# Patient Record
Sex: Female | Born: 1990 | Race: Black or African American | Hispanic: No | Marital: Single
Health system: Southern US, Community
[De-identification: ages and names within clinical notes are randomized; demographics above are authoritative.]

---
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
## 2010-01-25 ENCOUNTER — Emergency Department: Payer: Self-pay | Admitting: Emergency Medicine

## 2010-02-08 ENCOUNTER — Emergency Department: Payer: Self-pay | Admitting: Emergency Medicine

## 2010-05-05 ENCOUNTER — Encounter: Payer: Self-pay | Admitting: Obstetrics and Gynecology

## 2010-05-12 ENCOUNTER — Encounter: Payer: Self-pay | Admitting: Maternal and Fetal Medicine

## 2010-05-16 ENCOUNTER — Encounter: Payer: Self-pay | Admitting: Maternal & Fetal Medicine

## 2010-05-17 ENCOUNTER — Encounter: Payer: Self-pay | Admitting: Pediatric Cardiology

## 2010-05-19 ENCOUNTER — Encounter: Payer: Self-pay | Admitting: Obstetrics and Gynecology

## 2010-05-23 ENCOUNTER — Encounter: Payer: Self-pay | Admitting: Maternal and Fetal Medicine

## 2010-05-26 ENCOUNTER — Encounter: Payer: Self-pay | Admitting: Obstetrics and Gynecology

## 2010-05-30 ENCOUNTER — Encounter: Payer: Self-pay | Admitting: Obstetrics and Gynecology

## 2010-05-31 ENCOUNTER — Encounter: Payer: Self-pay | Admitting: Pediatric Cardiology

## 2010-06-02 ENCOUNTER — Encounter: Payer: Self-pay | Admitting: Maternal & Fetal Medicine

## 2010-06-06 ENCOUNTER — Encounter: Payer: Self-pay | Admitting: Maternal & Fetal Medicine

## 2010-06-09 ENCOUNTER — Encounter: Payer: Self-pay | Admitting: Obstetrics and Gynecology

## 2010-06-13 ENCOUNTER — Encounter: Payer: Self-pay | Admitting: Maternal and Fetal Medicine

## 2010-06-20 ENCOUNTER — Encounter: Payer: Self-pay | Admitting: Maternal and Fetal Medicine

## 2010-06-27 ENCOUNTER — Encounter: Payer: Self-pay | Admitting: Obstetrics and Gynecology

## 2010-06-28 ENCOUNTER — Encounter: Payer: Self-pay | Admitting: Pediatric Cardiology

## 2010-06-29 ENCOUNTER — Encounter: Payer: Self-pay | Admitting: Maternal & Fetal Medicine

## 2011-09-12 ENCOUNTER — Emergency Department: Payer: Self-pay | Admitting: Emergency Medicine

## 2011-09-15 ENCOUNTER — Emergency Department: Payer: Self-pay | Admitting: Emergency Medicine

## 2011-09-16 LAB — URINALYSIS, COMPLETE
Bilirubin,UR: NEGATIVE
Ketone: NEGATIVE
Nitrite: NEGATIVE
Ph: 6 (ref 4.5–8.0)
Protein: NEGATIVE
RBC,UR: 21 /HPF (ref 0–5)
Squamous Epithelial: 8

## 2011-09-16 LAB — WET PREP, GENITAL

## 2011-12-21 IMAGING — US US FETAL BPP W/O NON-STRESS - NRPT
1 series · 8 of 8 positions shown · non-contrast
Comparison: none

[Series 1: us fetal bpp w/o non-stress - nrpt · 8 of 8 slices shown]
[im 1/8]
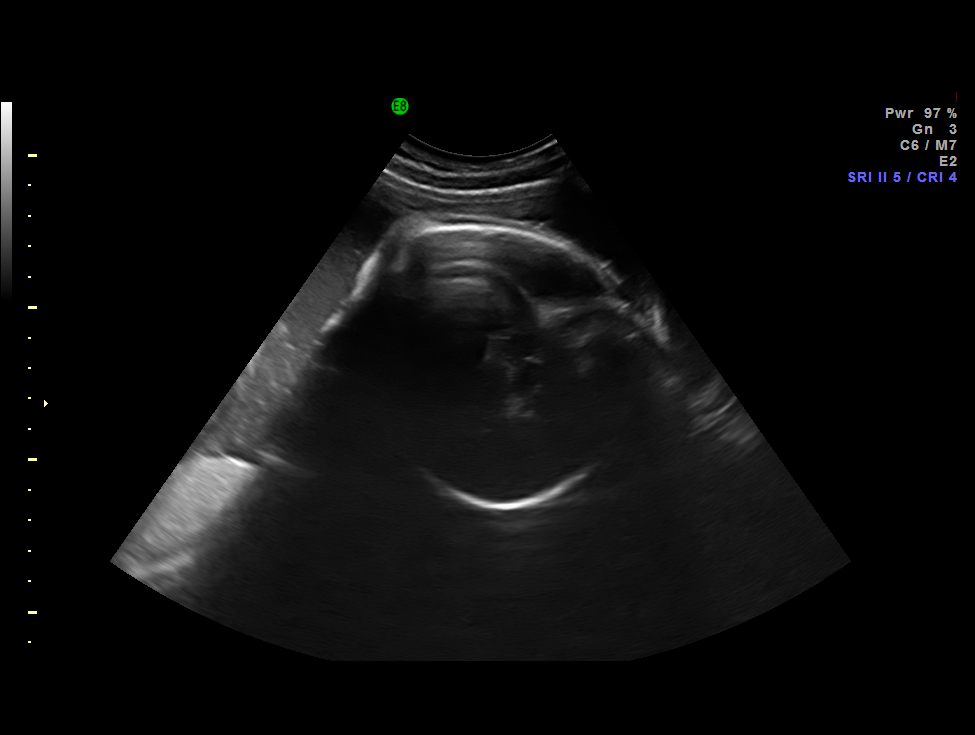
[im 2/8]
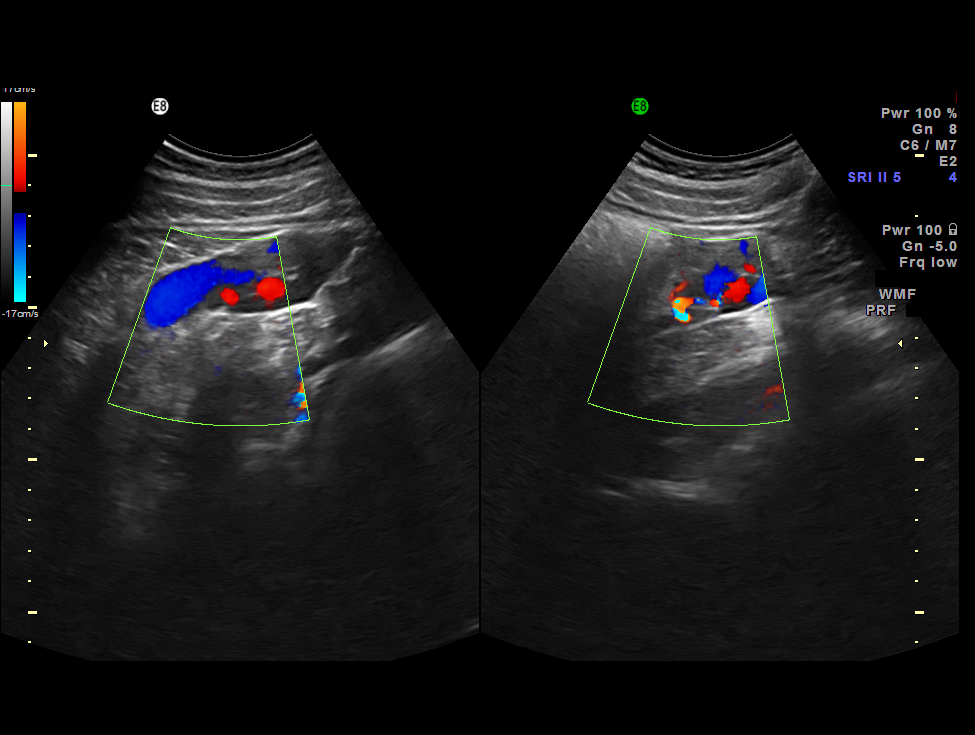
[im 3/8]
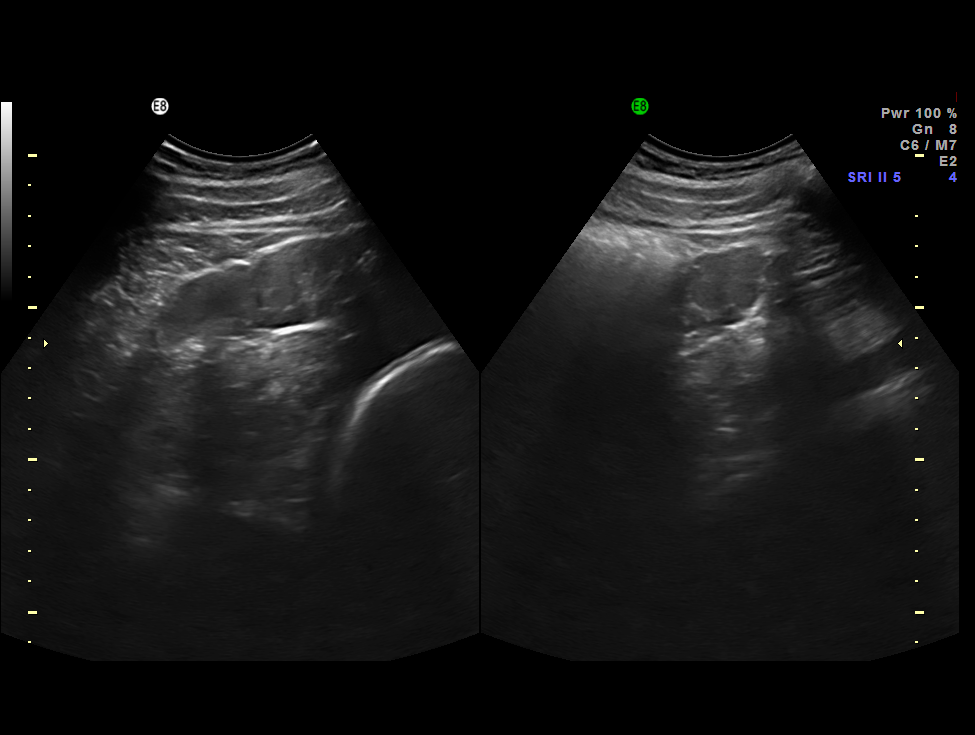
[im 4/8]
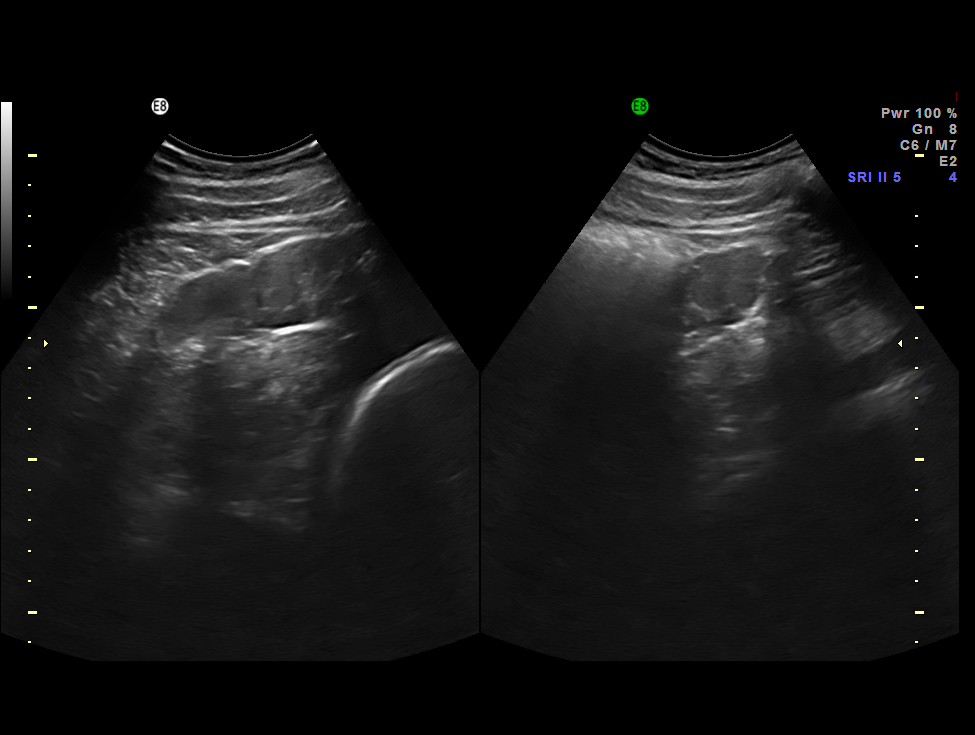
[im 5/8]
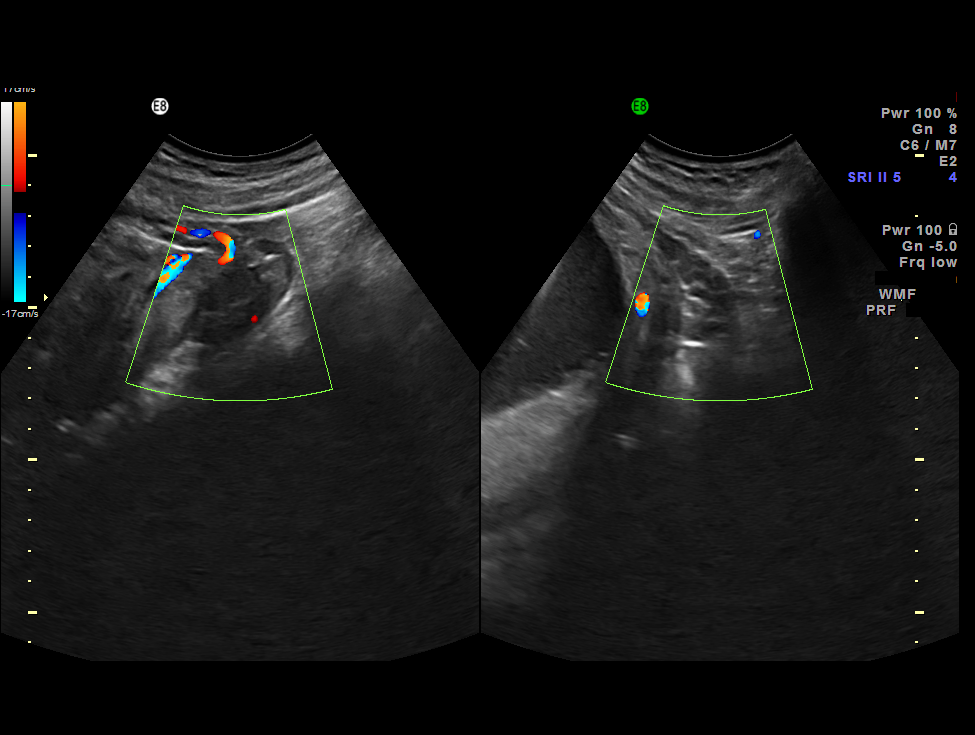
[im 6/8]
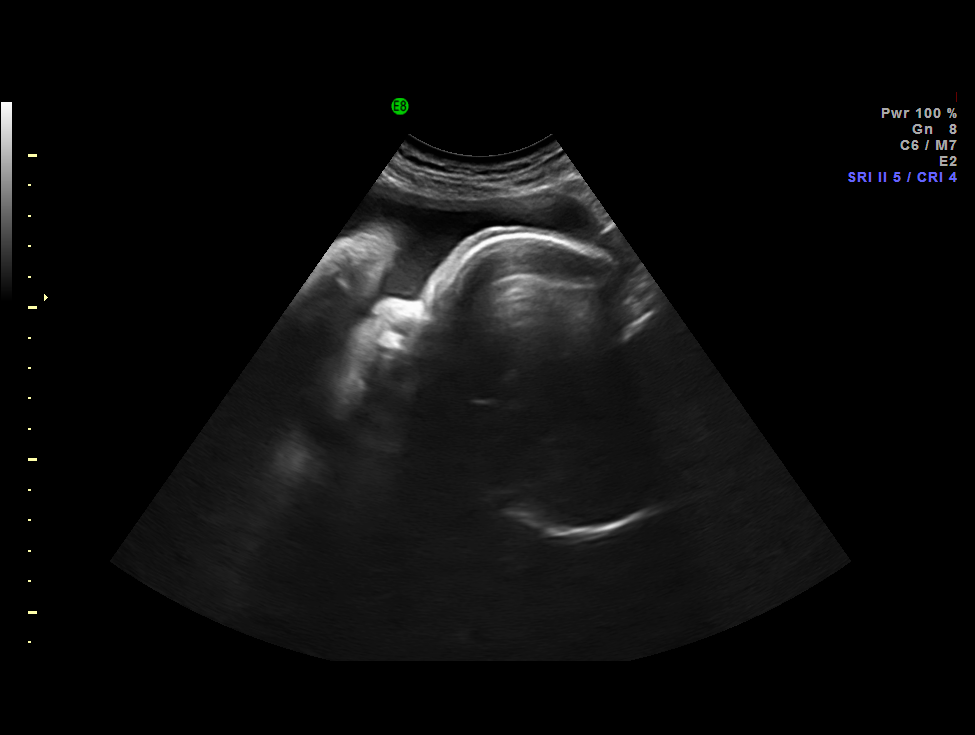
[im 7/8]
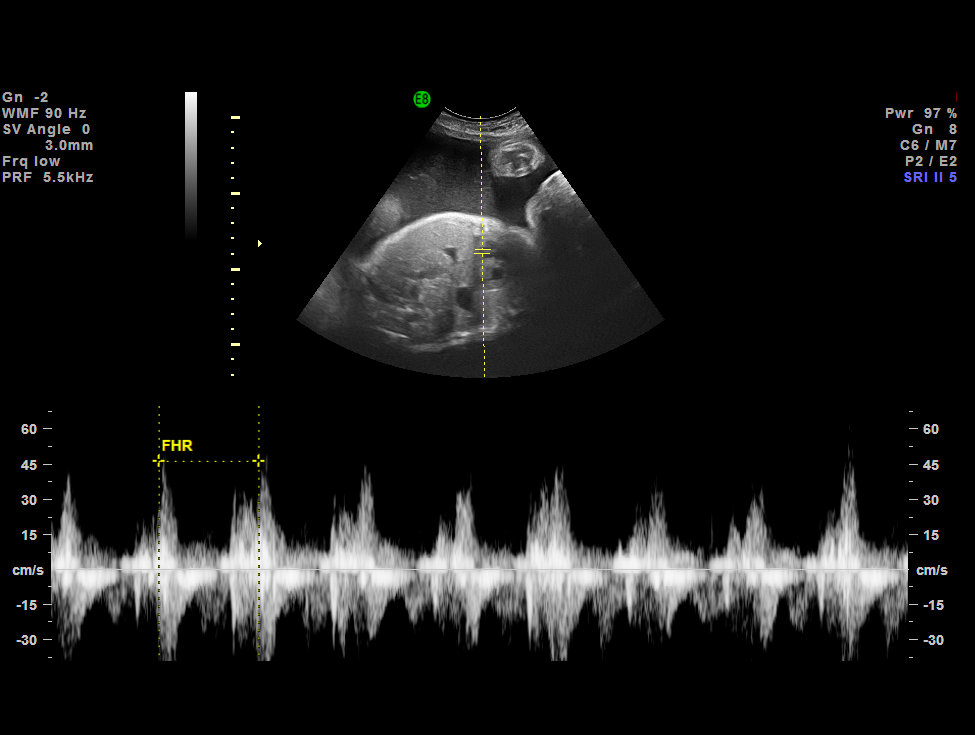
[im 8/8]
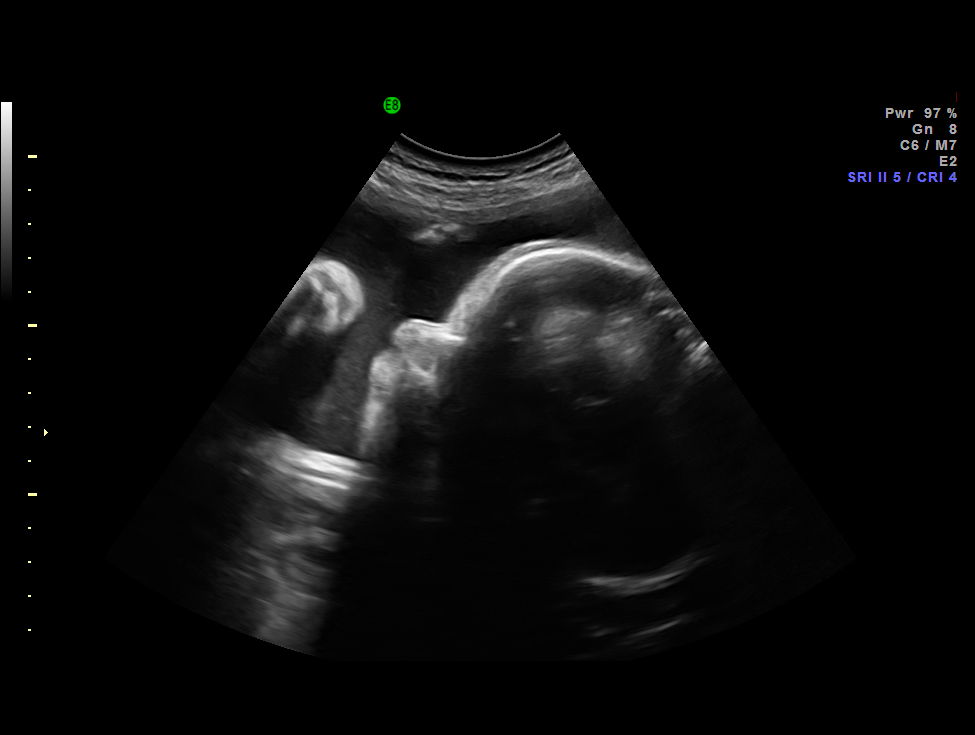

[8 of 8 positions shown; findings below may reference images not displayed]

IMAGES IMPORTED FROM THE [employer] WORKFLOW SYSTEM
NO DICTATION FOR STUDY

## 2011-12-29 ENCOUNTER — Emergency Department: Payer: Self-pay | Admitting: Emergency Medicine

## 2011-12-29 LAB — SALICYLATE LEVEL: Salicylates, Serum: <1.7 mg/dL

## 2011-12-29 LAB — CBC
HCT: 35.6 %
HGB: 11.3 g/dL — ABNORMAL LOW
MCH: 27.3 pg
MCHC: 31.7 g/dL — ABNORMAL LOW
MCV: 86 fL
Platelet: 221 10*3/uL
RBC: 4.14 X10 6/mm 3
RDW: 12.6 %
WBC: 9.7 10*3/uL

## 2011-12-29 LAB — COMPREHENSIVE METABOLIC PANEL
Albumin: 3.8 g/dL (ref 3.4–5.0)
Alkaline Phosphatase: 75 U/L (ref 50–136)
BUN: 8 mg/dL (ref 7–18)
Bilirubin,Total: 0.8 mg/dL (ref 0.2–1.0)
Glucose: 77 mg/dL (ref 65–99)
Osmolality: 265 (ref 275–301)
Potassium: 3 mmol/L — ABNORMAL LOW (ref 3.5–5.1)
SGOT(AST): 37 U/L (ref 15–37)
SGPT (ALT): 21 U/L
Sodium: 134 mmol/L — ABNORMAL LOW (ref 136–145)

## 2011-12-29 LAB — DRUG SCREEN, URINE
Amphetamines, Ur Screen: NEGATIVE
Barbiturates, Ur Screen: NEGATIVE
Benzodiazepine, Ur Scrn: NEGATIVE
Cannabinoid 50 Ng, Ur ~~LOC~~: NEGATIVE
Cocaine Metabolite,Ur ~~LOC~~: NEGATIVE
MDMA (Ecstasy)Ur Screen: NEGATIVE
Methadone, Ur Screen: NEGATIVE
Opiate, Ur Screen: NEGATIVE
Phencyclidine (PCP) Ur S: NEGATIVE
Tricyclic, Ur Screen: NEGATIVE

## 2011-12-29 LAB — URINALYSIS, COMPLETE
Bilirubin,UR: NEGATIVE
Blood: NEGATIVE
Glucose,UR: NEGATIVE mg/dL
Nitrite: NEGATIVE
Ph: 5
Protein: 30
RBC,UR: 1 /HPF
Specific Gravity: 1.021
Squamous Epithelial: 7
WBC UR: 10 /HPF

## 2011-12-29 LAB — PREGNANCY, URINE: Pregnancy Test, Urine: POSITIVE m[IU]/mL

## 2011-12-29 LAB — ACETAMINOPHEN LEVEL: Acetaminophen: <2 ug/mL

## 2011-12-29 LAB — ETHANOL
Ethanol %: <0.003 %
Ethanol: <3 mg/dL

## 2011-12-29 LAB — HCG, QUANTITATIVE, PREGNANCY: Beta Hcg, Quant.: 1197 m[IU]/mL — ABNORMAL HIGH

## 2011-12-31 ENCOUNTER — Other Ambulatory Visit: Payer: Self-pay | Admitting: Emergency Medicine

## 2011-12-31 LAB — HCG, QUANTITATIVE, PREGNANCY: Beta Hcg, Quant.: 1795 m[IU]/mL — ABNORMAL HIGH

## 2012-01-29 ENCOUNTER — Emergency Department: Payer: Self-pay | Admitting: *Deleted

## 2012-01-29 LAB — URINALYSIS, COMPLETE
Bilirubin,UR: NEGATIVE
Blood: NEGATIVE
Ketone: NEGATIVE
Nitrite: NEGATIVE
Specific Gravity: 1.028 (ref 1.003–1.030)
Squamous Epithelial: 13
WBC UR: 7 /HPF (ref 0–5)

## 2012-01-29 LAB — COMPREHENSIVE METABOLIC PANEL
Alkaline Phosphatase: 64 U/L (ref 50–136)
BUN: 14 mg/dL (ref 7–18)
Bilirubin,Total: 0.5 mg/dL (ref 0.2–1.0)
Chloride: 101 mmol/L (ref 98–107)
Co2: 28 mmol/L (ref 21–32)
Creatinine: 0.65 mg/dL (ref 0.60–1.30)
EGFR (Non-African Amer.): >60
Osmolality: 274 (ref 275–301)
SGPT (ALT): 15 U/L
Total Protein: 8.3 g/dL — ABNORMAL HIGH (ref 6.4–8.2)

## 2012-01-29 LAB — CBC
HCT: 39 % (ref 35.0–47.0)
HGB: 12.8 g/dL (ref 12.0–16.0)
Platelet: 223 10*3/uL (ref 150–440)
RDW: 12.8 % (ref 11.5–14.5)
WBC: 11.8 10*3/uL — ABNORMAL HIGH (ref 3.6–11.0)

## 2012-01-29 LAB — TROPONIN I: Troponin-I: <0.02 ng/mL

## 2012-04-06 ENCOUNTER — Emergency Department: Payer: Self-pay | Admitting: Emergency Medicine

## 2012-04-06 LAB — COMPREHENSIVE METABOLIC PANEL
Albumin: 3 g/dL — ABNORMAL LOW (ref 3.4–5.0)
Anion Gap: 8 (ref 7–16)
BUN: 6 mg/dL — ABNORMAL LOW (ref 7–18)
Bilirubin,Total: 0.4 mg/dL (ref 0.2–1.0)
Chloride: 100 mmol/L (ref 98–107)
Creatinine: 0.53 mg/dL — ABNORMAL LOW (ref 0.60–1.30)
EGFR (African American): >60
Glucose: 71 mg/dL (ref 65–99)
Potassium: 3.7 mmol/L (ref 3.5–5.1)
SGOT(AST): 16 U/L (ref 15–37)
Sodium: 135 mmol/L — ABNORMAL LOW (ref 136–145)
Total Protein: 7.5 g/dL (ref 6.4–8.2)

## 2012-04-06 LAB — URINALYSIS, COMPLETE
Glucose,UR: NEGATIVE mg/dL (ref 0–75)
Nitrite: NEGATIVE
Protein: NEGATIVE
RBC,UR: 3 /HPF (ref 0–5)

## 2012-04-06 LAB — CBC
HCT: 34.7 % — ABNORMAL LOW (ref 35.0–47.0)
HGB: 11.4 g/dL — ABNORMAL LOW (ref 12.0–16.0)
Platelet: 207 10*3/uL (ref 150–440)
RDW: 12.6 % (ref 11.5–14.5)
WBC: 13.3 10*3/uL — ABNORMAL HIGH (ref 3.6–11.0)

## 2012-04-30 ENCOUNTER — Observation Stay: Payer: Self-pay | Admitting: Obstetrics & Gynecology

## 2012-05-01 LAB — URINALYSIS, COMPLETE
Bilirubin,UR: NEGATIVE
Blood: NEGATIVE
Ketone: NEGATIVE
Nitrite: NEGATIVE
Ph: 7 (ref 4.5–8.0)
WBC UR: 14 /HPF (ref 0–5)

## 2012-07-13 ENCOUNTER — Observation Stay: Payer: Self-pay | Admitting: Obstetrics & Gynecology

## 2012-07-13 LAB — URINALYSIS, COMPLETE
Bilirubin,UR: NEGATIVE
Blood: NEGATIVE
Glucose,UR: NEGATIVE mg/dL (ref 0–75)
Ph: 6 (ref 4.5–8.0)
Protein: 30
Specific Gravity: 1.028 (ref 1.003–1.030)
Squamous Epithelial: 37

## 2012-08-04 ENCOUNTER — Observation Stay: Payer: Self-pay

## 2012-08-24 ENCOUNTER — Observation Stay: Payer: Self-pay | Admitting: Obstetrics and Gynecology

## 2012-08-28 ENCOUNTER — Inpatient Hospital Stay: Payer: Self-pay | Admitting: Obstetrics & Gynecology

## 2012-08-28 LAB — CBC WITH DIFFERENTIAL/PLATELET
HCT: 30.5 % — ABNORMAL LOW (ref 35.0–47.0)
HGB: 9.3 g/dL — ABNORMAL LOW (ref 12.0–16.0)
Lymphocyte #: 2 10*3/uL (ref 1.0–3.6)
Lymphocyte %: 12.6 %
MCH: 22.6 pg — ABNORMAL LOW (ref 26.0–34.0)
MCHC: 30.6 g/dL — ABNORMAL LOW (ref 32.0–36.0)
MCV: 74 fL — ABNORMAL LOW (ref 80–100)
Monocyte %: 8.4 %
Neutrophil #: 12.4 10*3/uL — ABNORMAL HIGH (ref 1.4–6.5)
Platelet: 163 10*3/uL (ref 150–440)
RDW: 16.7 % — ABNORMAL HIGH (ref 11.5–14.5)
WBC: 15.8 10*3/uL — ABNORMAL HIGH (ref 3.6–11.0)

## 2012-08-29 LAB — HEMATOCRIT: HCT: 23.9 % — ABNORMAL LOW (ref 35.0–47.0)

## 2012-09-06 ENCOUNTER — Emergency Department: Payer: Self-pay | Admitting: Emergency Medicine

## 2012-09-06 LAB — COMPREHENSIVE METABOLIC PANEL
Albumin: 2.8 g/dL — ABNORMAL LOW (ref 3.4–5.0)
Alkaline Phosphatase: 137 U/L — ABNORMAL HIGH (ref 50–136)
Anion Gap: 7 (ref 7–16)
BUN: 6 mg/dL — ABNORMAL LOW (ref 7–18)
Bilirubin,Total: 0.3 mg/dL (ref 0.2–1.0)
Calcium, Total: 8.6 mg/dL (ref 8.5–10.1)
Co2: 25 mmol/L (ref 21–32)
Glucose: 92 mg/dL (ref 65–99)
Potassium: 4.2 mmol/L (ref 3.5–5.1)
SGOT(AST): 12 U/L — ABNORMAL LOW (ref 15–37)
SGPT (ALT): 13 U/L (ref 12–78)
Sodium: 137 mmol/L (ref 136–145)

## 2012-09-06 LAB — CBC
MCH: 21.7 pg — ABNORMAL LOW (ref 26.0–34.0)
MCHC: 29.4 g/dL — ABNORMAL LOW (ref 32.0–36.0)
RBC: 3.49 10*6/uL — ABNORMAL LOW (ref 3.80–5.20)
WBC: 14.3 10*3/uL — ABNORMAL HIGH (ref 3.6–11.0)

## 2012-09-06 LAB — LIPASE, BLOOD: Lipase: 70 U/L — ABNORMAL LOW (ref 73–393)

## 2012-12-03 ENCOUNTER — Emergency Department: Payer: Self-pay | Admitting: Emergency Medicine

## 2013-03-29 IMAGING — US US OB < 14 WEEKS
1 series · 17 of 28 positions shown · non-contrast
Comparison: none

REASON FOR EXAM: PT REQUEST FOR FETAL AGE, CONSIDERING ELECTIVE AB
COMMENTS:   May transport without cardiac monitor

PROCEDURE:     US  - US OB LESS THAN 14 WEEKS  - September 16, 2011  [DATE]
RESULT:     Comparison: 06/27/2000
TECHNIQUE: Multiple grayscale and color Doppler images were obtained of the
pelvis via transabdominal and endovaginal ultrasound.

[Series 1: us ob < 14 weeks · 17 of 54 slices shown]
[im 1/54]
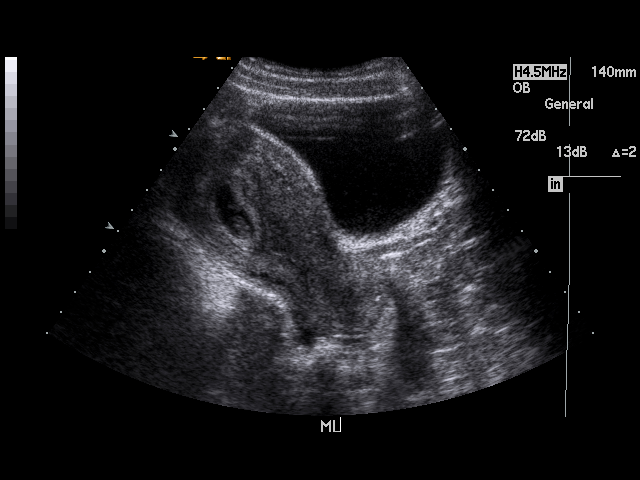
[im 4/54]
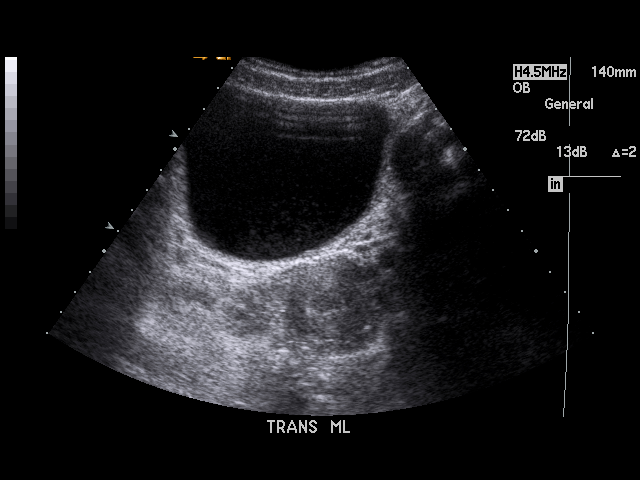
[im 8/54]
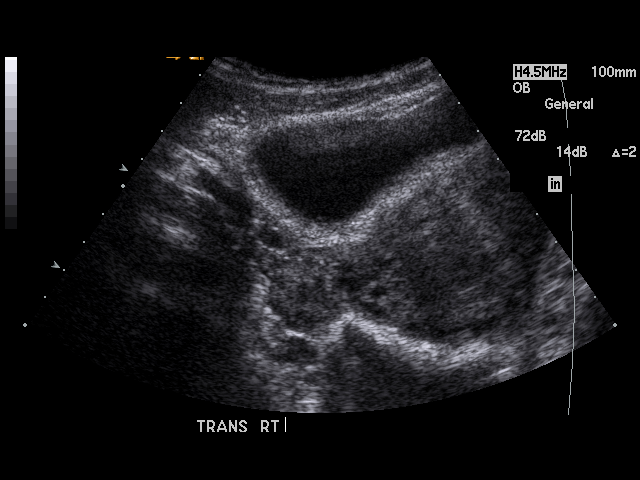
[im 10/54]
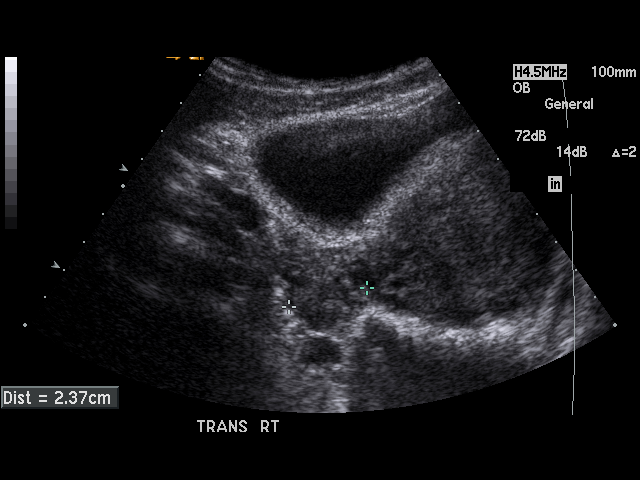
[im 14/54]
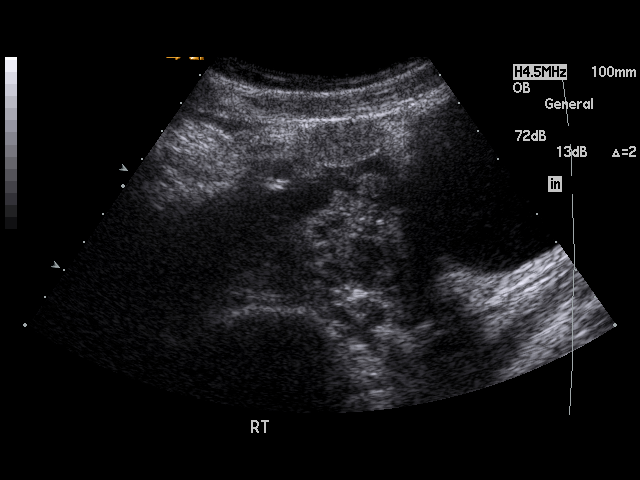
[im 18/54]
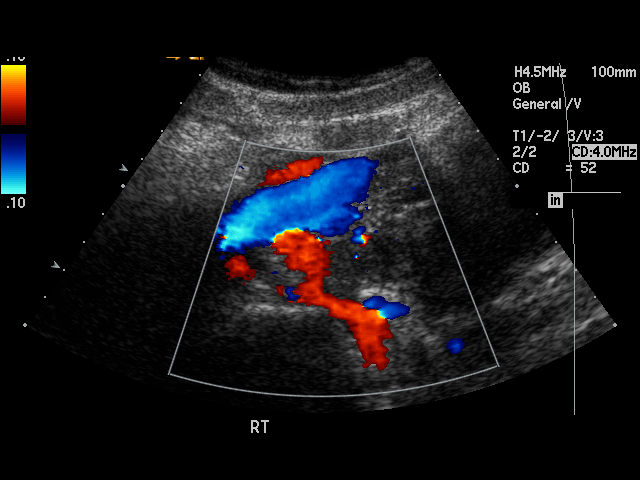
[im 20/54]
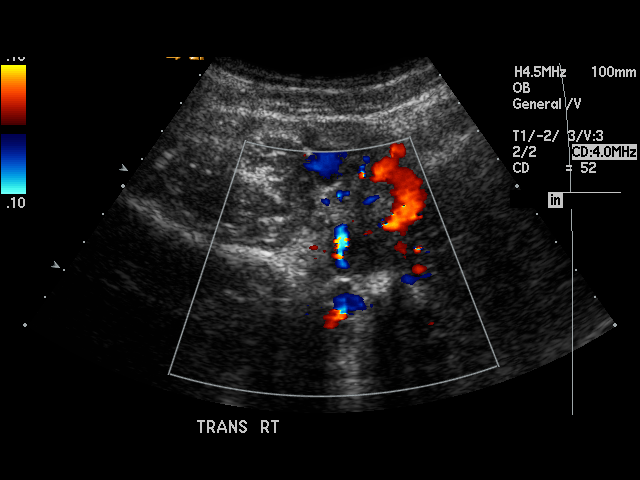
[im 24/54]
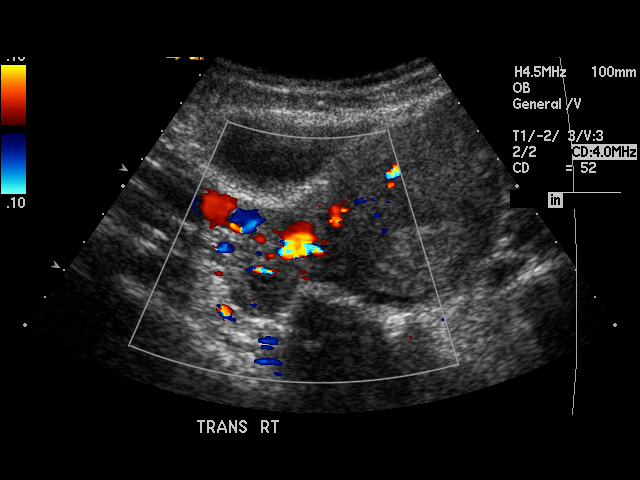
[im 28/54]
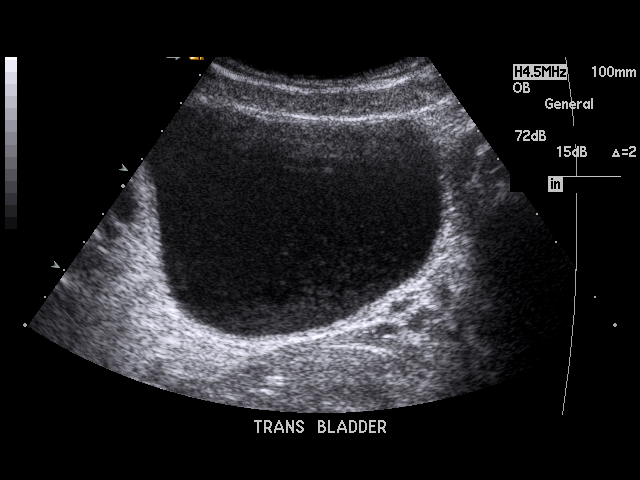
[im 30/54]
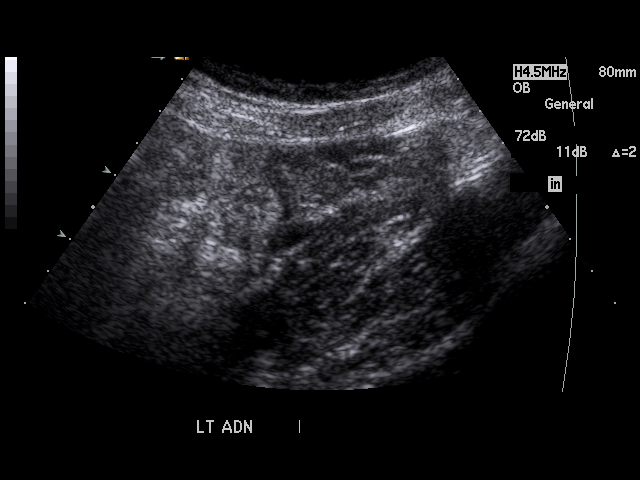
[im 34/54]
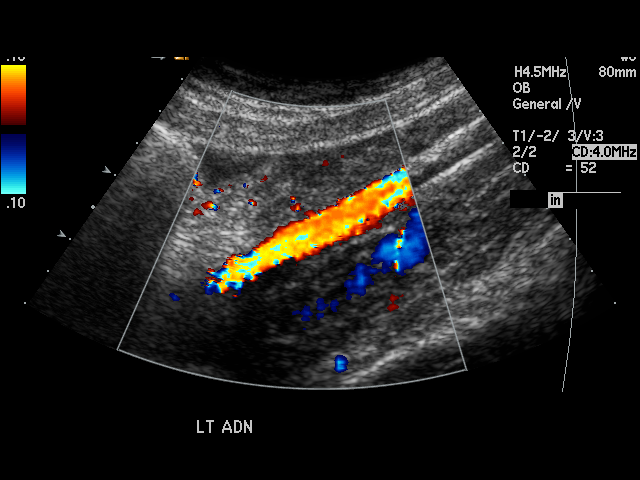
[im 36/54]
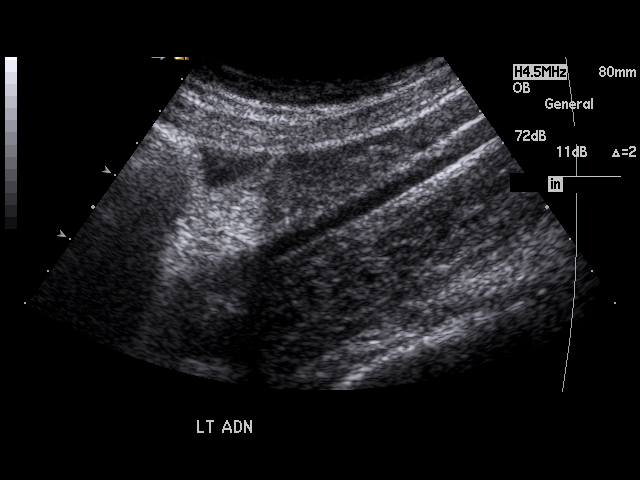
[im 40/54]
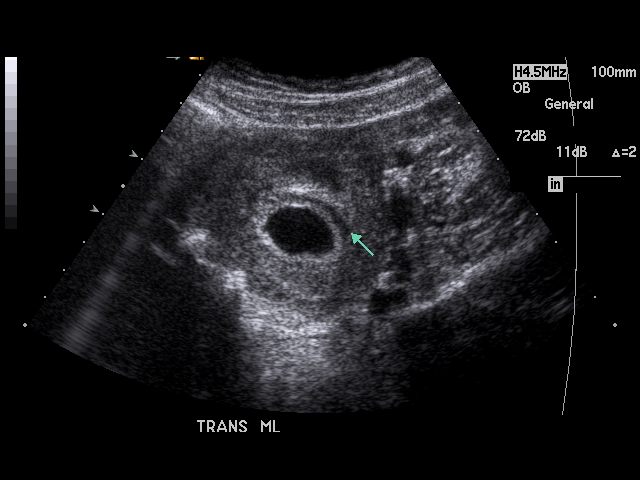
[im 44/54]
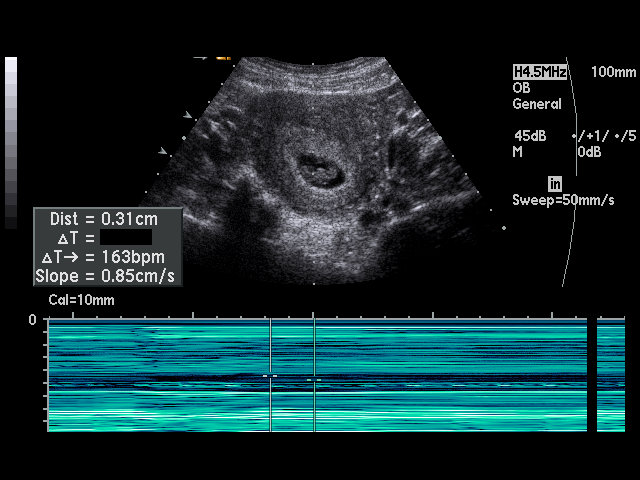
[im 46/54]
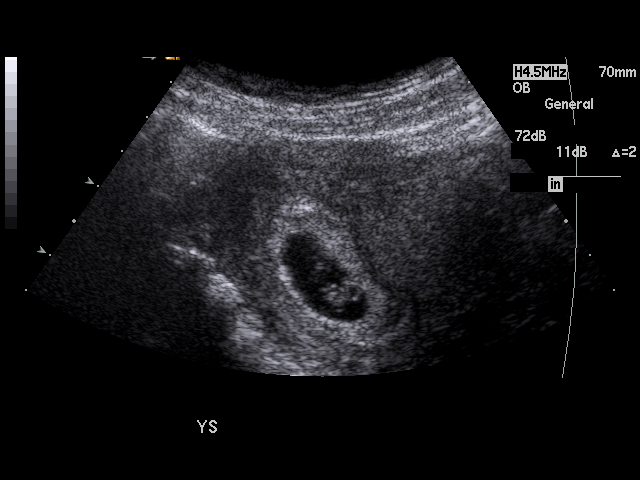
[im 50/54]
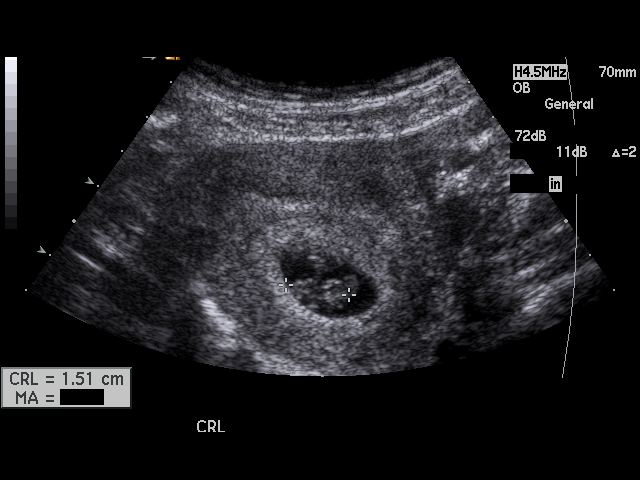
[im 54/54]
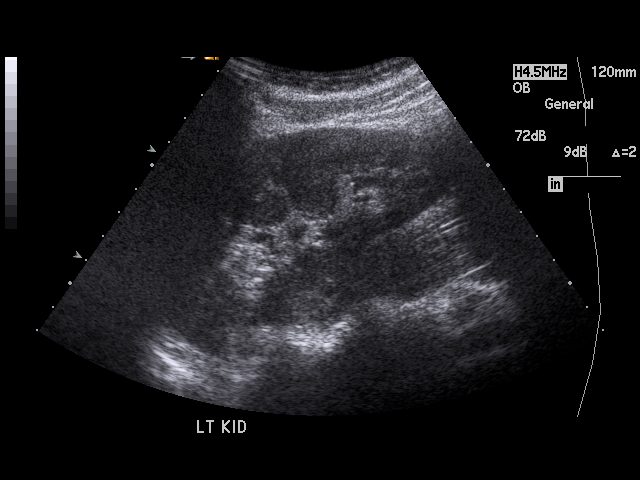

[17 of 28 positions shown; findings below may reference images not displayed]

FINDINGS: There is a fluid collection within the endometrial canal consistent with a
gestational sac. A fetal pole is identified with a crown-rump length of
cm, which correlates with an estimated gestational age of 7 weeks 6 days.
The fetal heart rate was 163 beats per minute. There is a thin hypoechoic
crescent adjacent to the gestational sac which may represent a small
subchorionic hematoma. A yolk sac is present.

The left ovary measures 2.0 x 2.6 x 1.2 cm. The right ovary measures 2.9 x
2.5 x 2.4 cm. Hypoechoic structure in the right the ovary may represent the
corpus luteum cyst. It measures 2.3 x 1.7 x 1.5 cm. Color Doppler flow is
associated with the bilateral ovaries. Spector Doppler imaging was not
performed.

There are multiple echoes within the bladder, which are nonspecific.
IMPRESSION: 1. Single live intrauterine pregnancy with estimated gestational age of 7
weeks 6 days.
2. Thin hypoechoic density adjacent to the gestational sac likely represents
a subchorionic hematoma. Followup pelvic ultrasound is recommended.
3. Nonspecific echoes in the bladder may represent debris or proteinaceous
material. Correlate with urinalysis.

## 2013-04-01 ENCOUNTER — Emergency Department: Payer: Self-pay | Admitting: Emergency Medicine

## 2013-07-11 IMAGING — US US OB < 14 WEEKS - US OB TV
1 series · 14 of 28 positions shown · non-contrast
Comparison: None

REASON FOR EXAM: pregnant  near faint
COMMENTS:

PROCEDURE:     US  - US OB LESS THAN 14 WEEKS/W TRANS  - December 29, 2011  [DATE]
RESULT:     Indication: Pregnant.
TECHNIQUE: Multiple transabdominal gray-scale images and endovaginal
gray-scale images of the pelvis performed.

[Series 1: us ob < 14 weeks - us ob tv · 0.18mm/px · 14 of 98 slices shown]
[im 4/98]
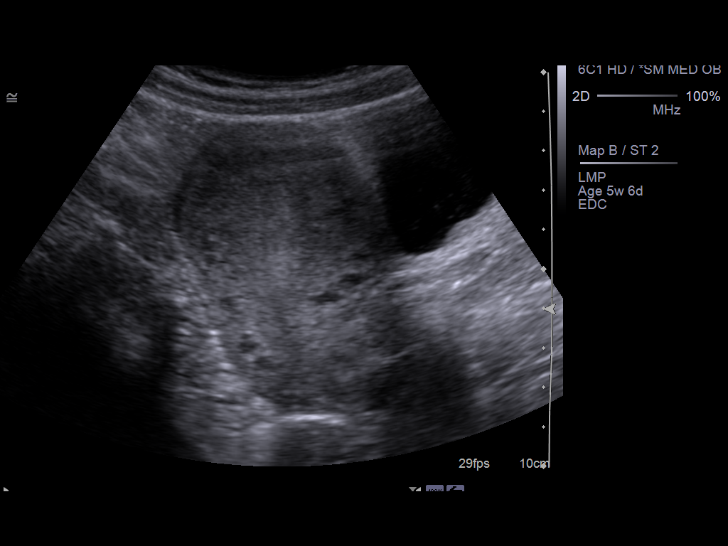
[im 11/98]
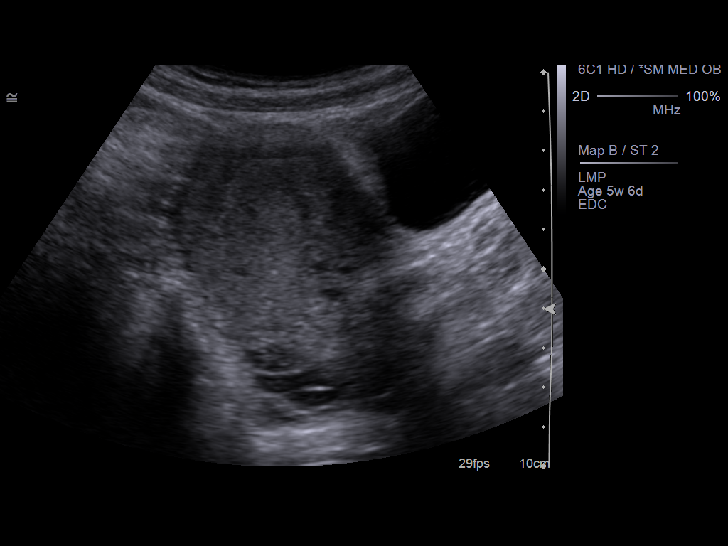
[im 18/98]
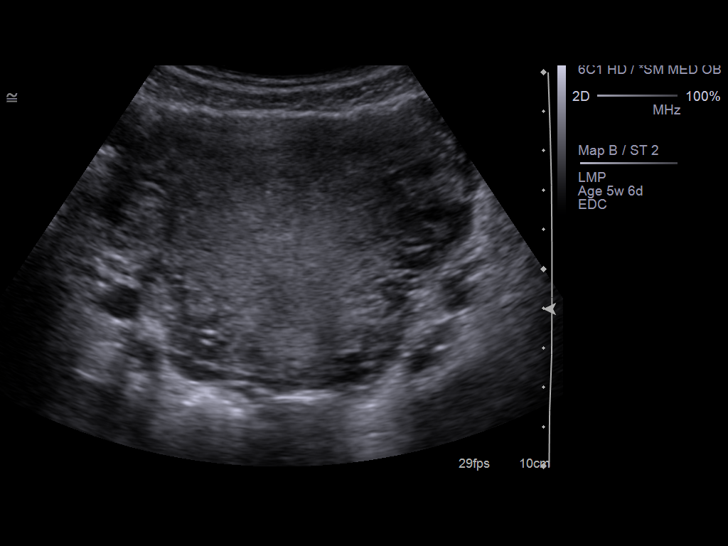
[im 26/98]
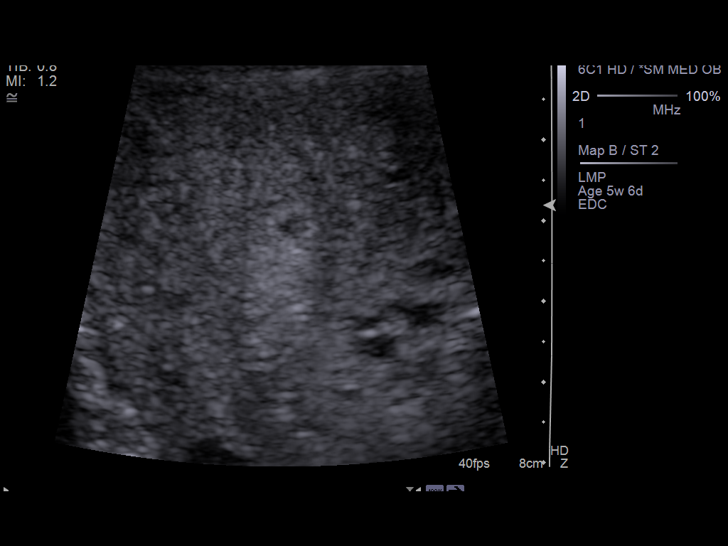
[im 33/98]
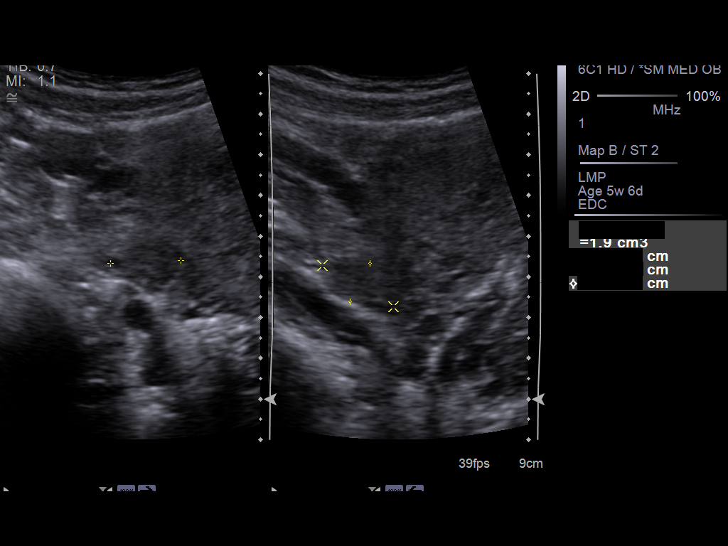
[im 40/98]
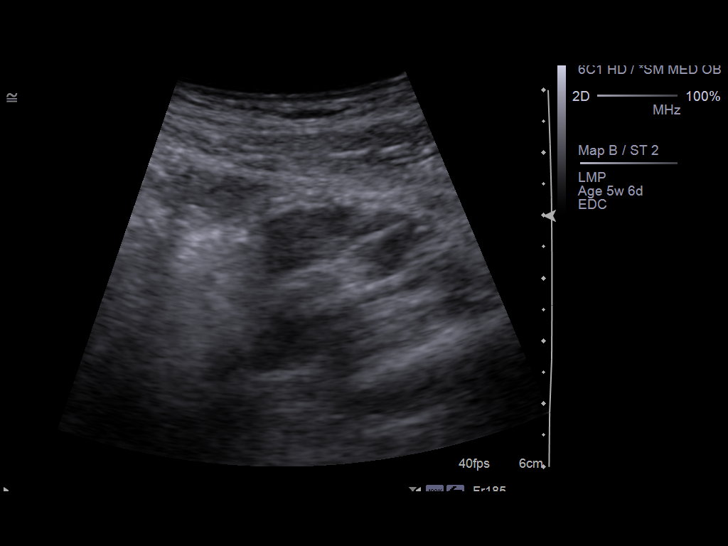
[im 47/98]
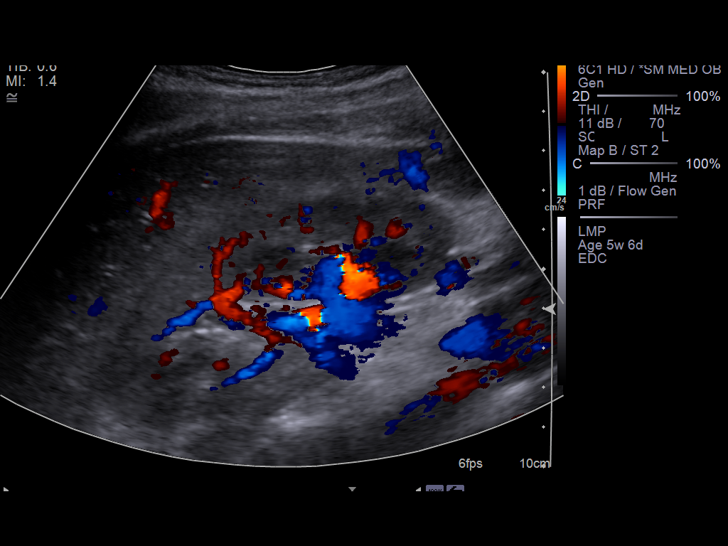
[im 54/98]
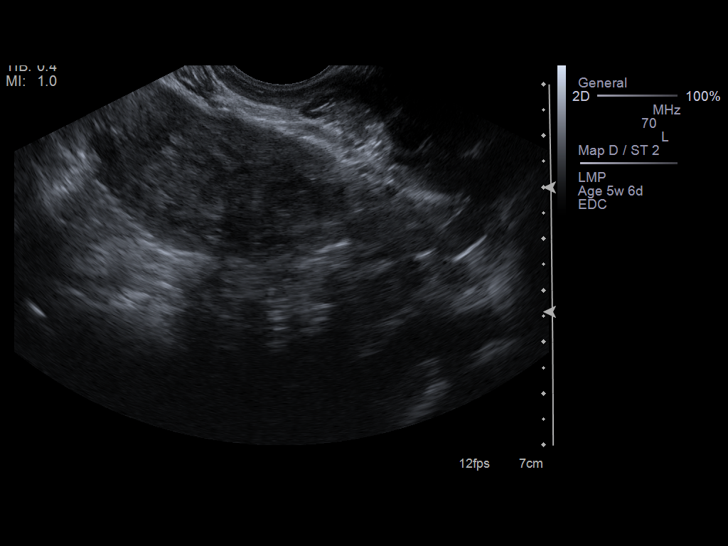
[im 62/98]
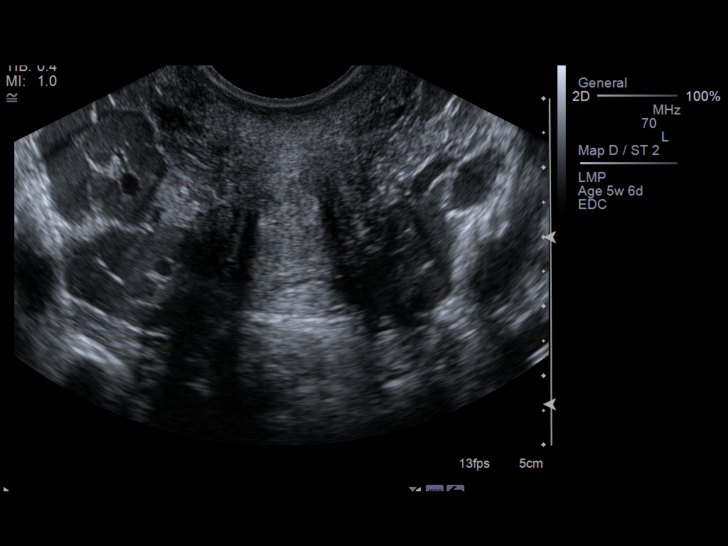
[im 69/98]
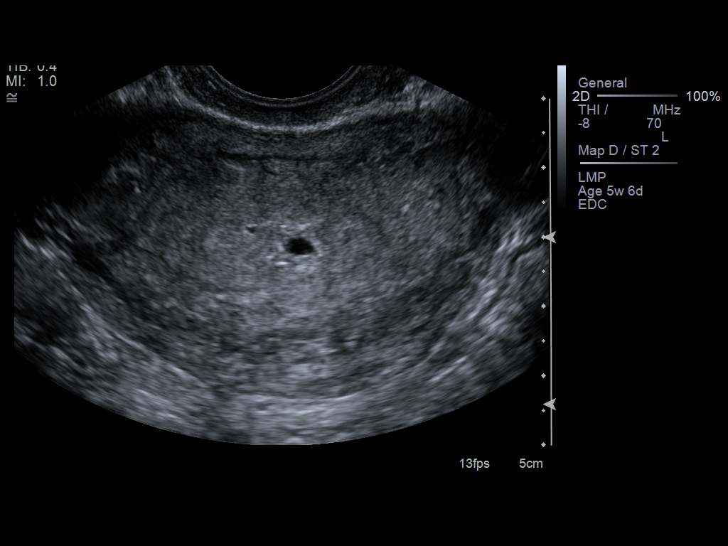
[im 76/98]
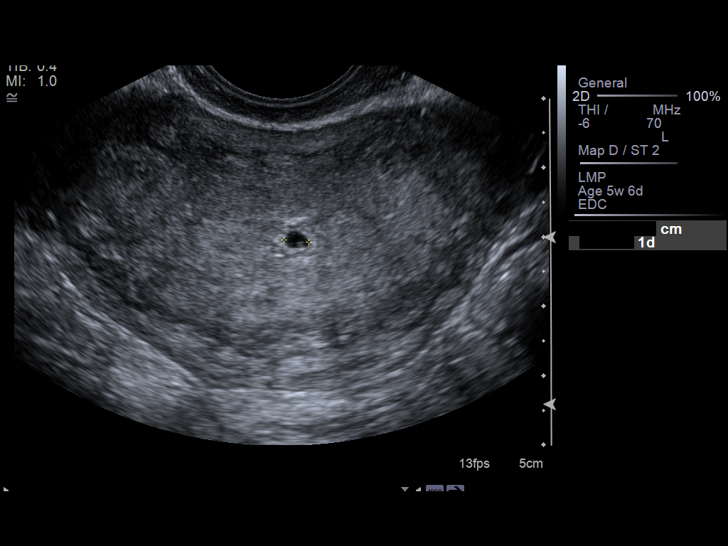
[im 83/98]
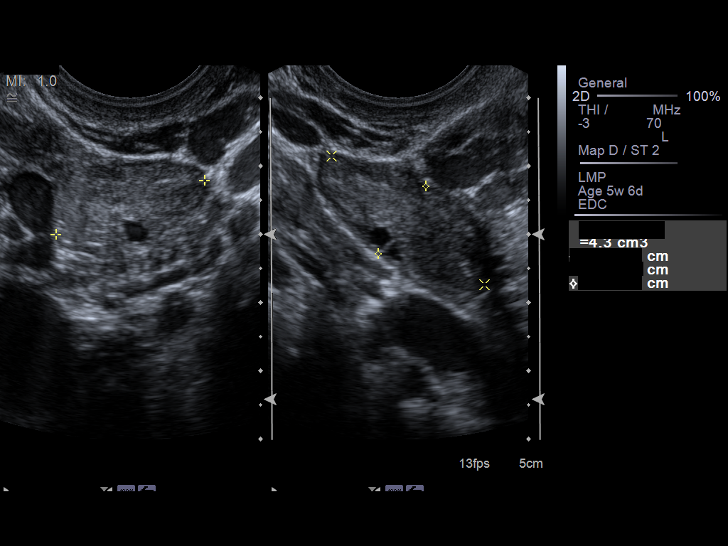
[im 90/98]
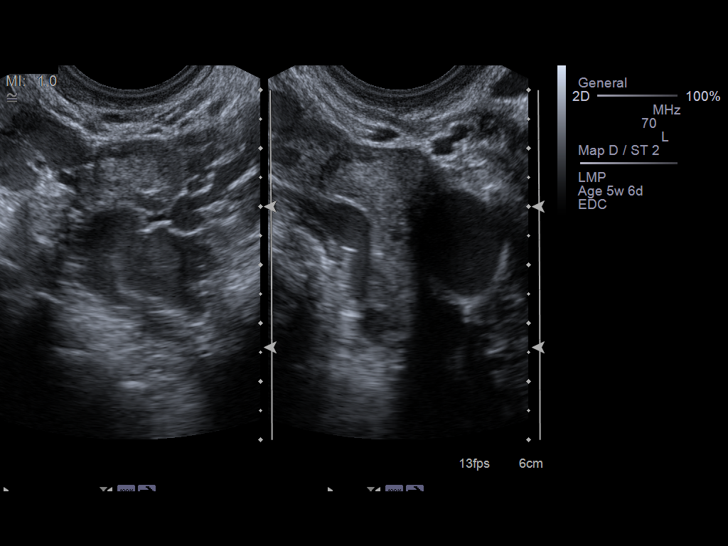
[im 98/98]
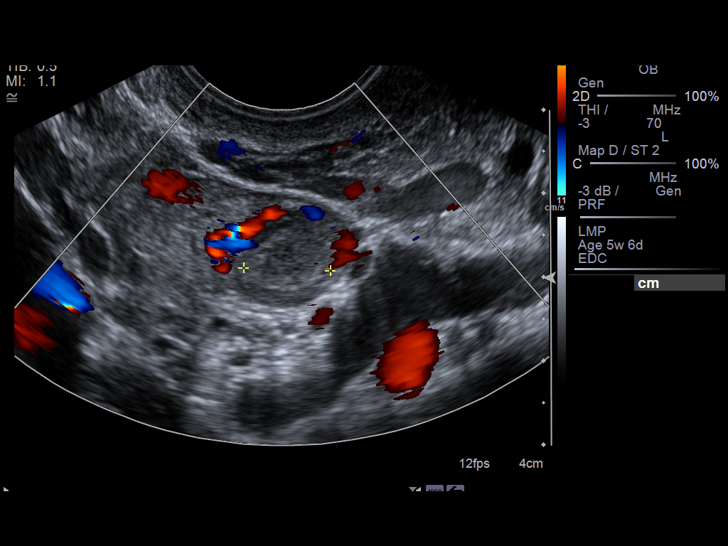

[14 of 28 positions shown; findings below may reference images not displayed]

FINDINGS: There is a cystic structure within the uterine cavity measuring 3.6 mm which
would date the pregnancy at 5 weeks one day. There is no yolk sac or fetal
pole visualized.

The right ovary measures 2.3 x 2.9 x 1.2 cm.  The left ovary measures 2.7 x
2.1 x 1.6 cm. There is a 1.5 cm cystic right ovarian mass which may
represent a simple cyst versus a corpus luteum cyst in the appropriate
setting.

There is no pelvic free fluid.
IMPRESSION: Cystic structure within the uterine cavity measuring 3.6 mm which would date
the pregnancy at 5 weeks one day. Differential considerations also include a
pseudogestational sac with an ectopic pregnancy versus a blighted ovum.
Recommend clinical correlation, serial quantitative beta HCGs, ectopic
precautions, and followup ultrasound as clinically indicated.

## 2013-07-27 ENCOUNTER — Emergency Department: Payer: Self-pay | Admitting: Emergency Medicine

## 2013-11-26 ENCOUNTER — Emergency Department: Payer: Self-pay

## 2013-11-26 LAB — CBC
HCT: 33 % — ABNORMAL LOW (ref 35.0–47.0)
HGB: 9.7 g/dL — ABNORMAL LOW (ref 12.0–16.0)
MCH: 19.9 pg — AB (ref 26.0–34.0)
MCHC: 29.5 g/dL — AB (ref 32.0–36.0)
MCV: 68 fL — AB (ref 80–100)
Platelet: 244 10*3/uL (ref 150–440)
RBC: 4.88 10*6/uL (ref 3.80–5.20)
RDW: 17 % — AB (ref 11.5–14.5)
WBC: 7 10*3/uL (ref 3.6–11.0)

## 2013-11-26 LAB — GC/CHLAMYDIA PROBE AMP

## 2013-11-26 LAB — HCG, QUANTITATIVE, PREGNANCY: BETA HCG, QUANT.: 15 m[IU]/mL — AB

## 2013-11-26 LAB — WET PREP, GENITAL

## 2013-12-02 ENCOUNTER — Emergency Department: Payer: Self-pay | Admitting: Emergency Medicine

## 2014-03-20 IMAGING — US US OB < 14 WEEKS
1 series · 14 of 28 positions shown · non-contrast
Comparison: none

REASON FOR EXAM: 9 days postpartum with worsening vaginal bleeding,
concerned for retained produc
COMMENTS:

PROCEDURE:     US  - US OB LESS THAN 14 WEEKS  - September 06, 2012  [DATE]
RESULT:     History: Postpartum bleeding.

[Series 1: us ob < 14 weeks · 0.28mm/px · 14 of 47 slices shown]
[im 2/47]
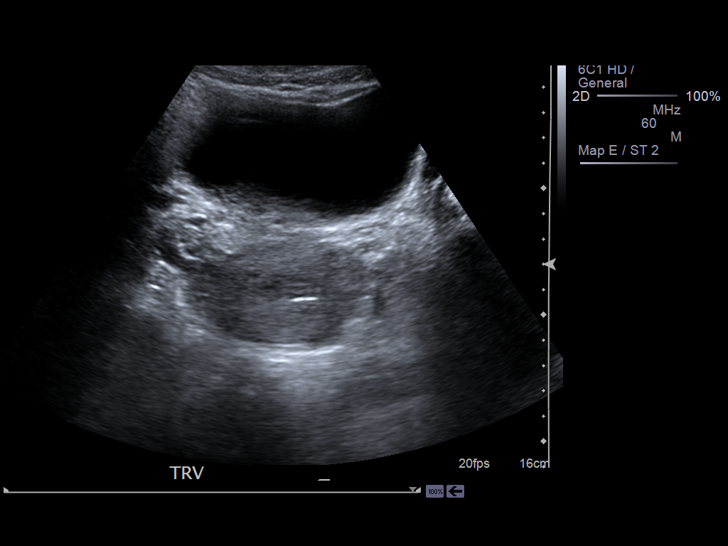
[im 6/47]
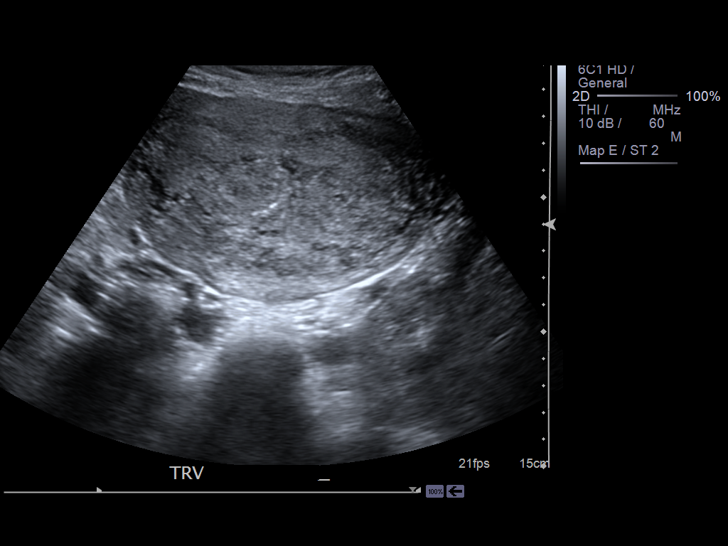
[im 9/47]
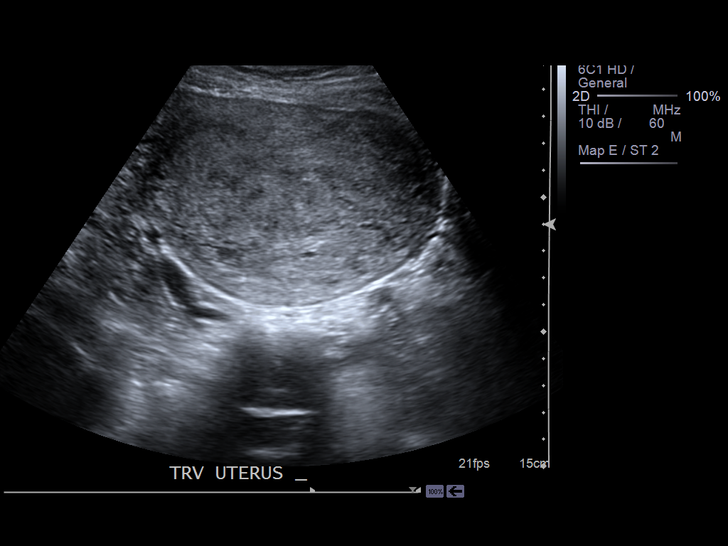
[im 12/47]
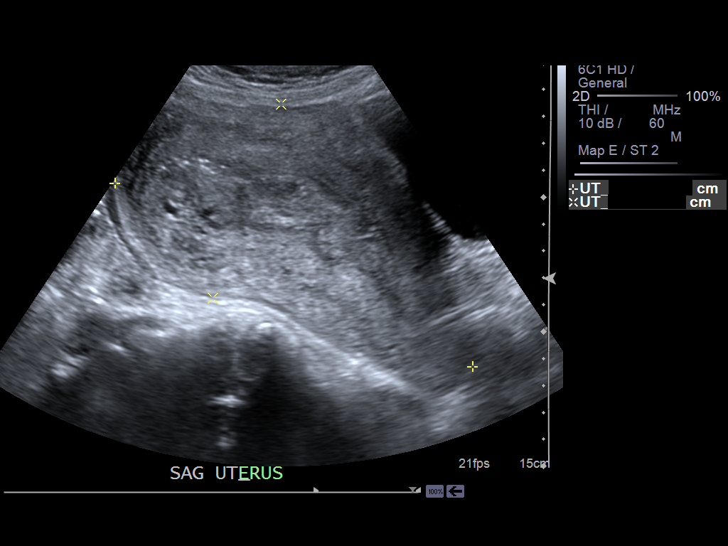
[im 16/47]
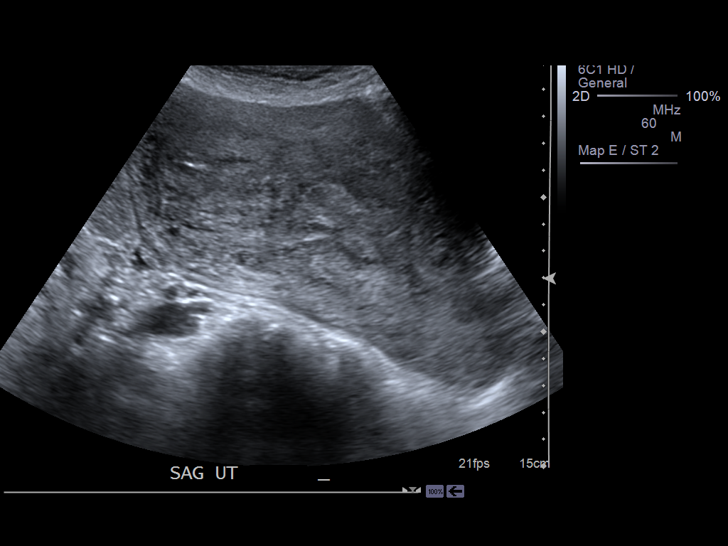
[im 19/47]
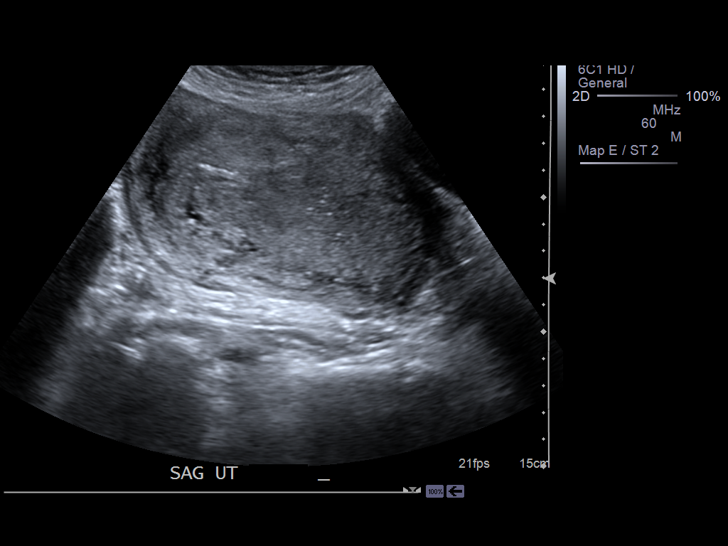
[im 23/47]
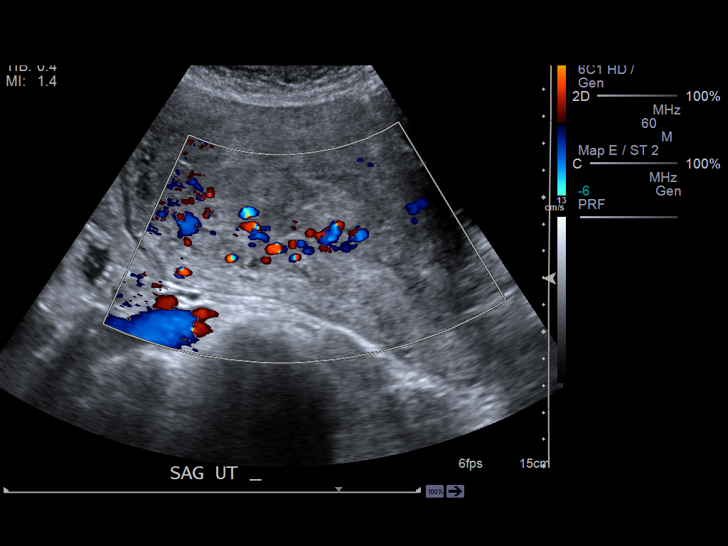
[im 26/47]
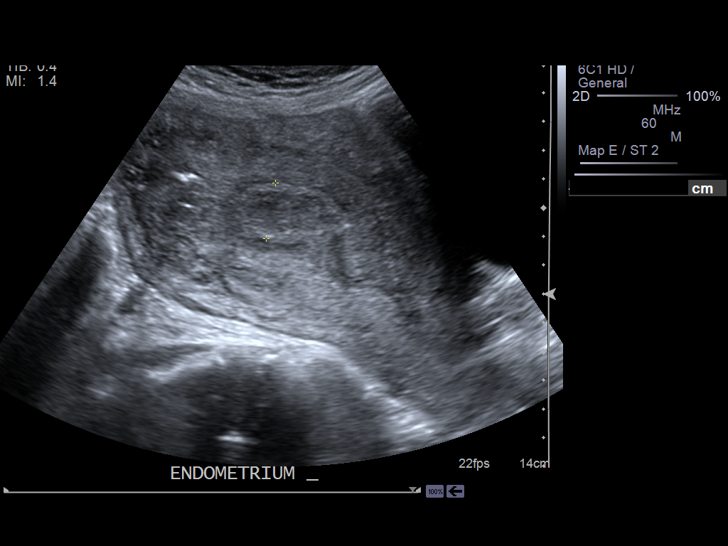
[im 29/47]
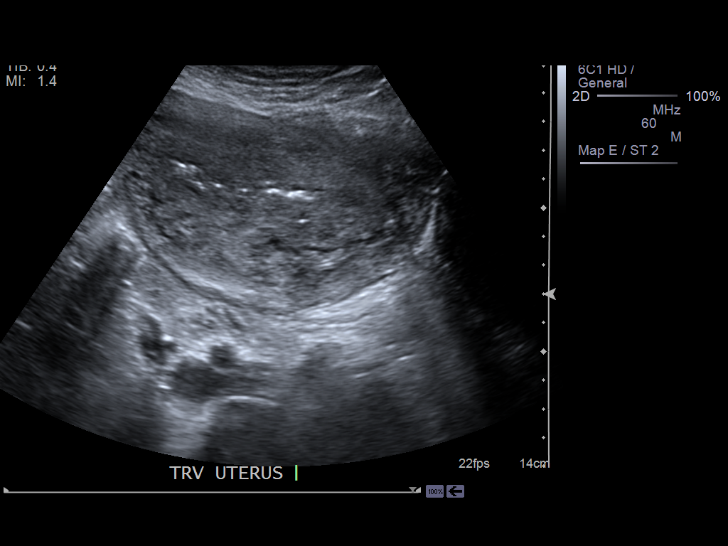
[im 33/47]
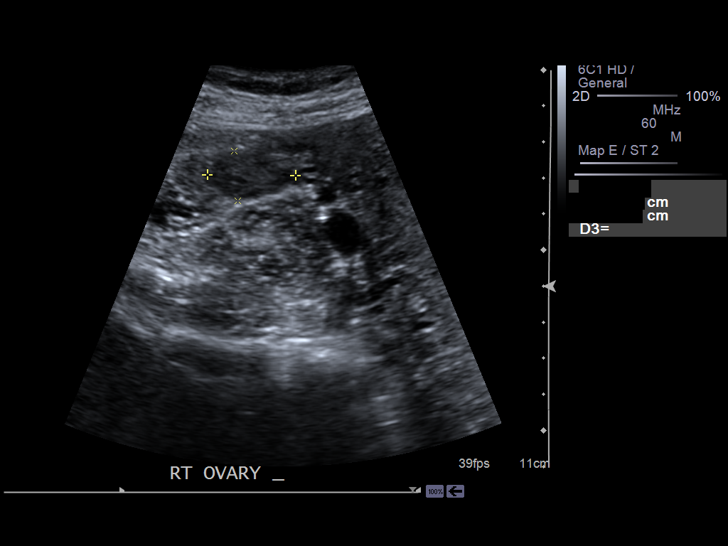
[im 36/47]
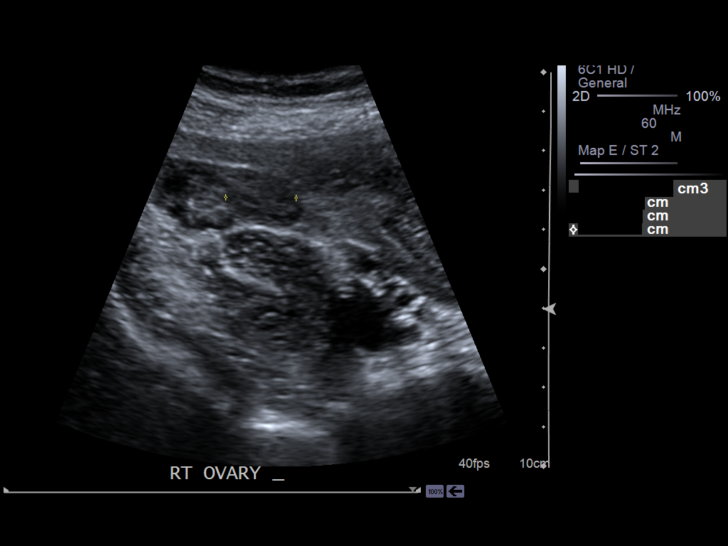
[im 40/47]
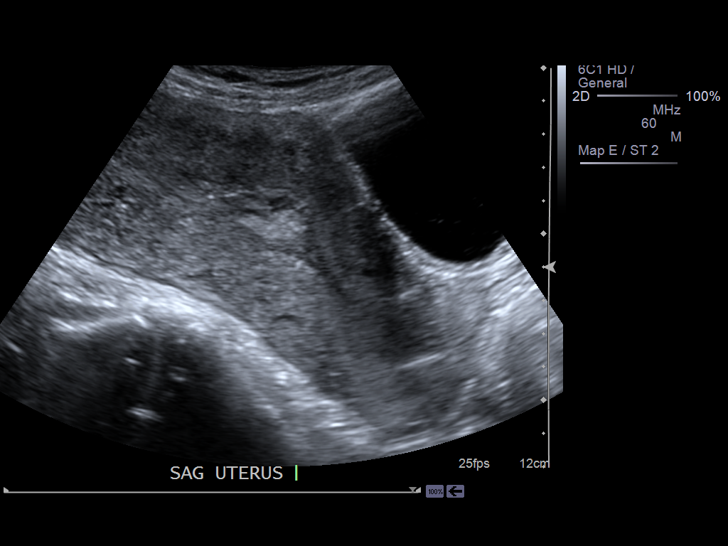
[im 43/47]
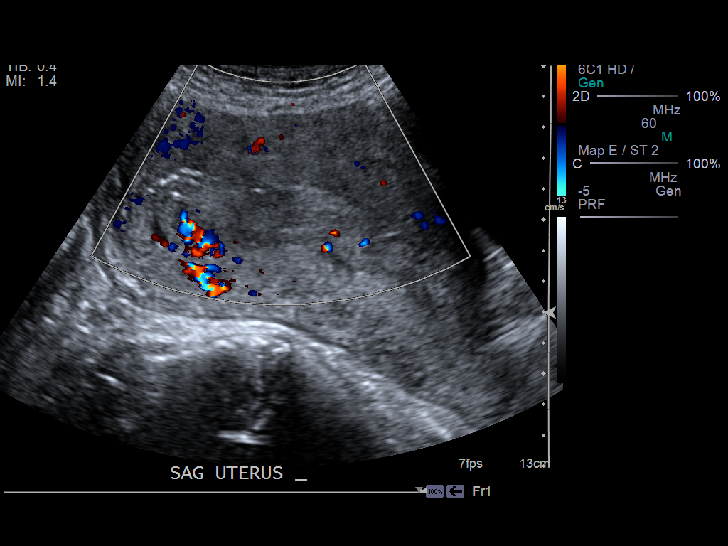
[im 47/47]
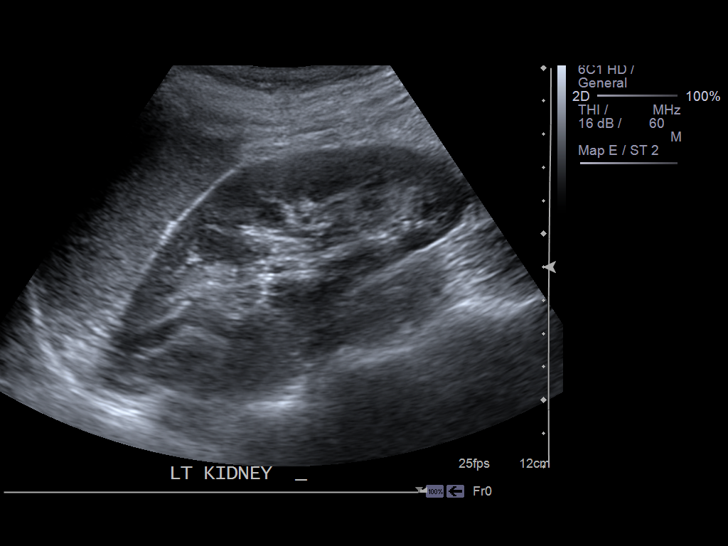

[14 of 28 positions shown; findings below may reference images not displayed]

FINDINGS: Endometrium is thick and is heterogeneous and mildly hyperemic.
Endometrium measures 2 cm in thickness. Although no products of conception
are definitely identified,  small amount cannot be completely excluded.
Uterus is unremarkable otherwise. Ovaries are unremarkable. Normal flow. No
significant free fluid.
IMPRESSION: Thickened heterogeneous endometrium as described above.

## 2014-11-13 NOTE — Consult Note (Signed)
PATIENT NAME:  Margaret Hood, Margaret Hood MR#:  [id number] DATE OF BIRTH:  June 05, 1991  DATE OF CONSULTATION:  09/06/2012  REFERRING PHYSICIAN:   CONSULTING PHYSICIAN:  Conard NovakStephen Hood. Temiloluwa Recchia, MD  REASON FOR CONSULTATION: Bleeding and abdominal pain postpartum.   HISTORY OF PRESENT ILLNESS: Ms. Margaret Hood is a 24 year old G2 P1-0-1-1 who is postpartum day number 669 after spontaneous vaginal delivery on February 5th. Her postpartum course was complicated by a postpartum hemorrhage that was treated with manual exploration as well as misoprostol rectally. She had a slight fever peri-delivery; however, was afebrile after that, although she did run a temperature up to about 37.8 during her hospitalization. She was discharged on postpartum day number 2 in stable condition.   Since her discharge, she says that she has had some blood clots pass, on postpartum days number 4 and 5, but has otherwise had just irregularly heavy bleeding at times, but only small clots. She presented mainly today because she had cramping on her right side for which she took ibuprofen 600 mg, x 1 dose. She states the ibuprofen did not relieve her pain. Notably, she does have some Percocet at home, which she states she did not try to take for this pain. She states the pain felt like contraction pain, that it did not radiate, that it is located on her right lower quadrant and her right lower side. She felt like she had fevers earlier in the week, about 4 days ago, but has not had any recently. Nothing she has tried alleviates the cramping feeling that she had, nothing makes it worse. She rates the pain as 10 out of 10 when it is at its worst.  REVIEW OF SYSTEMS: Negative x 10 systems reviewed, unless otherwise noted in the HPI.   PAST MEDICAL HISTORY:  1. Anxiety.  2. History of abnormal Pap smear.   PAST SURGICAL HISTORY: None.  MEDICATIONS: None currently.   ALLERGIES:  No known drug allergies.   SOCIAL HISTORY: The patient denies tobacco or  alcohol. No illegal drug use.   FAMILY HISTORY: Noncontributory.   PHYSICAL EXAMINATION: VITAL SIGNS: Temperature 37.6 degrees Celsius, respiratory rate 16, blood pressure 104/44, pulse 90 and O2 saturation 100% on room air.  GENERAL: No apparent distress, the patient is intermittently falling asleep during the interview.  CARDIOVASCULAR: Regular rate and rhythm.  PULMONARY: Clear to auscultation bilaterally.  ABDOMEN: Soft, is mildly tender to palpation in the right lower quadrant. Fundus is firm with appropriate tenderness at approximately 3 to 4 cm below the umbilicus. There is no guarding, no rebound.  PELVIC EXAM:  She has normal external female genitalia. Normal Bartholin, Skene and urethral meatus. Vagina is without lesions or source of bleeding. The cervix has mild amount of what appears to be dark, red blood with a whitish discharge mixed with it. There is approximally 5 mL in the vaginal vault, of the same sort of discharge. On bimanual pelvic examination, she has some mild cervical tenderness with negative chandelier sign. Her right adnexa is mildly tender to palpation, which is slightly greater on the right than her left. She has otherwise no uterine tenderness.  EXTREMITIES: No erythema, cords or tenderness.  LABORATORY RESULTS:  CBC: White blood cell count 14.3, hemoglobin 7.6, hematocrit 25.7, platelet count 397. Hematocrit from 08/29/2012 equals 23.9. Quantitative hCG equals 141. Gonorrhea and Chlamydia by clinic acid amplification on February 6th were both negative. All other labs are within normal limits.   IMAGING: Pelvic ultrasound, per report, states thickened heterogeneous endometrium  and mildly hyperemic. The endometrium measures 2 cm in thickness. States although no products of conception are definitely identified, a small amount cannot be completely excluded. The uterus is unremarkable otherwise. Ovaries are unremarkable. No significant free fluid in the pelvis.    ASSESSMENT: This is a 24 year old gravida 3, para 2-0-1-1 who is now postpartum day number 9 after a spontaneous vaginal delivery who has abdominopelvic pain in the setting of no fever and only mild abdominal tenderness. The initial consultation was due to heavy bleeding; however, the patient's hematocrit is actually stable to increased since her hospitalization and in speaking with her is not her chief concern. Her chief concern is pain in her right lower quadrant. She certainly has some tenderness on that side. However, she has no definite source. She is also afebrile with her T-max here at the hospital of 37.6 and afebrile at home, per the patient's report. So endometritis is a consideration; however, she may be subclinical or subacute at this point.   RECOMMENDATIONS:  1. I recommend she be seen in followup within the next several days, as far as clinical status checking goes. However, she may be discharged from the ER safely at this time. She is not a good candidate for admission.  2. I discussed postpartum endometritis with her and gave her warning signs that should prompt her to return to the Emergency Room. These include developing of fever of 100.4 degrees Fahrenheit or 38 degrees Celsius or higher. If she should have symptoms worsening in some other way, like worsening abdominal pain, worsening cramping and/or worsening bleeding. She may initially call the on-call physician on for [facility], that would likely be the best initial course of action, and base any followup based on her conversation with the on-call physician. Some physicians will treat empirically and preventatively for endometritis in the setting where a patient may have a subclinical or subacute situation and may attempt to try treating with Augmentin for about 7 days if that is your desire.  3. As far as bleeding goes, she has a stable to slightly higher hematocrit as compared to her last one taken during her hospitalization last  week. She is not a good candidate for medical treatment. She did receive a shot of Depo-Provera prior to her discharge and this may explain why she is having the heavy bleeding along with her normal postpartum state.  ____________________________ Conard Novak, MD sdj:sb Hood: 09/06/2012 07:52:21 ET T: 09/06/2012 08:37:26 ET JOB#: [id number]  cc: Conard Novak, MD, <Dictator> Conard Novak MD ELECTRONICALLY SIGNED 09/25/2012 7:33

## 2014-11-13 NOTE — Consult Note (Signed)
Brief Consult Note: Diagnosis: post partum abdominal-pelvic pain.   Patient was seen by consultant.   Consult note dictated.   Discussed with Attending MD.   Comments: She may have a subclinical endometritis.  However, she has not had fevers at home and she is afebrile in the ER.  Her abdominal tenderness is mild and may be appropriate given her post partum status.  She does have a malodorous discharge on exam.   - I recommend she be seen in follow up within the next several days to check on her clinical status - I discussed postpartum endometritis with her and she should return to the ER should she develop a fever of 100.4 (38C) or higher.  If her symptoms should worsen in some other way, she may try to call the on-call physician from [facility].  If you were treat empirically for endometritis and want to try treatment as an outpatient, I would recommend something that is broad specturm, like Augmentin for about 7 days. - As far as bleeding goes, she has a stable to slightly higher hematocrit as compared to her last one taken during her hospitalization last week.  Electronic Signatures: Conard NovakJackson, Stephen D (MD)  (Signed 14-Feb-14 07:31)  Authored: Brief Consult Note   Last Updated: 14-Feb-14 07:31 by Conard NovakJackson, Stephen D (MD)

## 2014-12-01 NOTE — H&P (Signed)
L&D Evaluation:  History:   HPI [age] year old G3 P1010 with EDC=08/31/2012 by an ultrasound presents from the office at 394/7 weeks for an IOL due to worsening constant back pain and dilation to 4 cm. She also has a history of her first child having a cardiac defect (Tetrology of Fallot), and later died at 1915 mos of age.  She is feeling some tightening of her uterus, but pain is not necessarily associated with the contractions. Denies VB or LOF. [facility] and [facility] at 26 weeks. Her care has been remarkable for anxiety, h/o ASC-H (needs a colposcopy), +Chlamydia 02/02/12 with negative TOC, and a negative fetal echocardiogram. OB HX: SVD 5#6oz female in 07/16/10 after PItocin induction/augmentation. LABS: O POS, RI, GBS negative    Presents with IOL    Patient's Medical History Anxiety, abnormal Pap    Patient's Surgical History none    Medications Tylenol (Acetaminophen)    Allergies NKDA    Social History none    Family History Non-Contributory   ROS:   ROS see HPI   Exam:   Vital Signs stable    General no apparent distress    Mental Status clear    Chest clear    Heart normal sinus rhythm, no murmur/gallop/rubs    Abdomen gravid, non-tender    Estimated Fetal Weight Average for gestational age    Fetal Position LOA    Edema no edema    Reflexes 1+    Pelvic no external lesions, 4/long/-3 (not well applied to cx)    Mebranes Intact    FHT normal rate with no decels, 150 baseline with accels    Fetal Heart Rate 155    Ucx mild irregular ctxs    Skin dry   Impression:   Impression IUP at term for IOL   Plan:   Plan EFM/NST, monitor contractions and for cervical change, Plan Pitocin induction of labor. Discussed risk of hyperstimulation, FITL, C-section. Patient vebalizes understanding and wishes to proceed.   Electronic Signatures: Trinna BalloonGutierrez, Jillien Yakel L (CNM)  (Signed 05-Feb-14 10:57)  Authored: L&D Evaluation   Last  Updated: 05-Feb-14 10:57 by Trinna BalloonGutierrez, Nuriyah Hanline L (CNM)
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
## 2023-03-23 ENCOUNTER — Other Ambulatory Visit: Payer: Self-pay

## 2023-03-27 ENCOUNTER — Ambulatory Visit (LOCAL_COMMUNITY_HEALTH_CENTER): Payer: Self-pay

## 2023-03-27 ENCOUNTER — Other Ambulatory Visit: Payer: Self-pay

## 2023-03-27 DIAGNOSIS — Z111 Encounter for screening for respiratory tuberculosis: Secondary | ICD-10-CM

## 2023-03-30 ENCOUNTER — Ambulatory Visit (LOCAL_COMMUNITY_HEALTH_CENTER): Payer: Self-pay

## 2023-03-30 LAB — TB SKIN TEST
Induration: 0 mm
TB Skin Test: NEGATIVE

## 2023-08-14 LAB — PANORAMA PRENATAL TEST FULL PANEL:PANORAMA TEST PLUS 5 ADDITIONAL MICRODELETIONS: FETAL FRACTION: 10.4
